# Patient Record
Sex: Male | Born: 1970 | Race: White | Hispanic: No | Marital: Married | Smoking: Never smoker
Health system: Southern US, Community
[De-identification: ages and names within clinical notes are randomized; demographics above are authoritative.]

## PROBLEM LIST (undated history)

## (undated) DIAGNOSIS — S86009A Unspecified injury of unspecified Achilles tendon, initial encounter: Secondary | ICD-10-CM

## (undated) HISTORY — PX: MOUTH SURGERY: SHX715

---

## 2007-08-26 ENCOUNTER — Ambulatory Visit (HOSPITAL_COMMUNITY): Admission: RE | Admit: 2007-08-26 | Discharge: 2007-08-26 | Payer: Self-pay | Admitting: Internal Medicine

## 2009-06-03 IMAGING — CR DG ANKLE COMPLETE 3+V*L*
3 series · 3 of 3 positions shown · non-contrast
Comparison: None

CLINICAL DATA: LEFT ANKLE COMPLETE - 3+ VIEW

Basketball injury - lateral pain and swelling

[t ankle joint ap left]
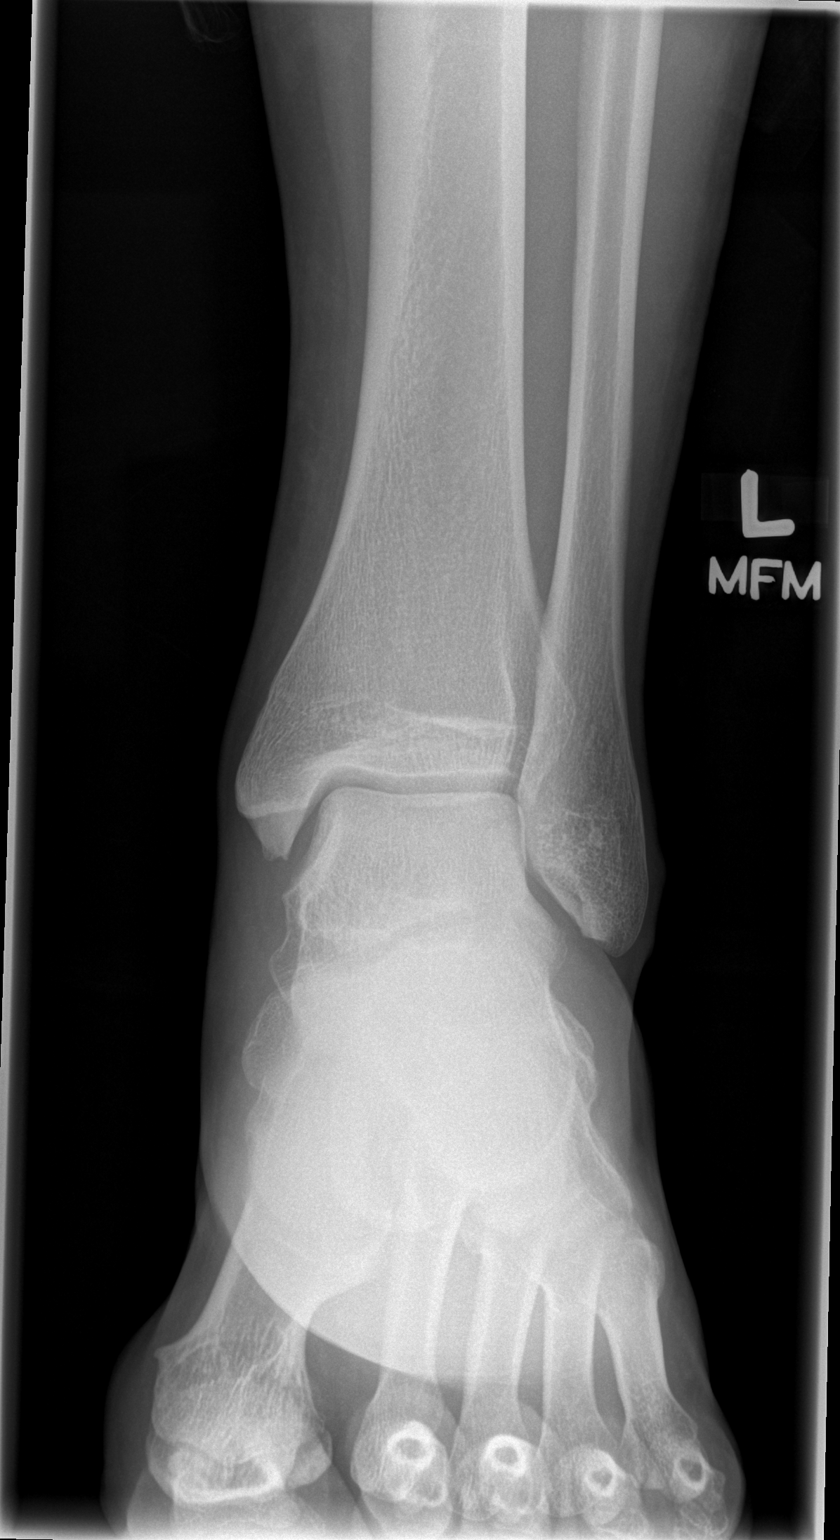

[t ankle joint oblique left]
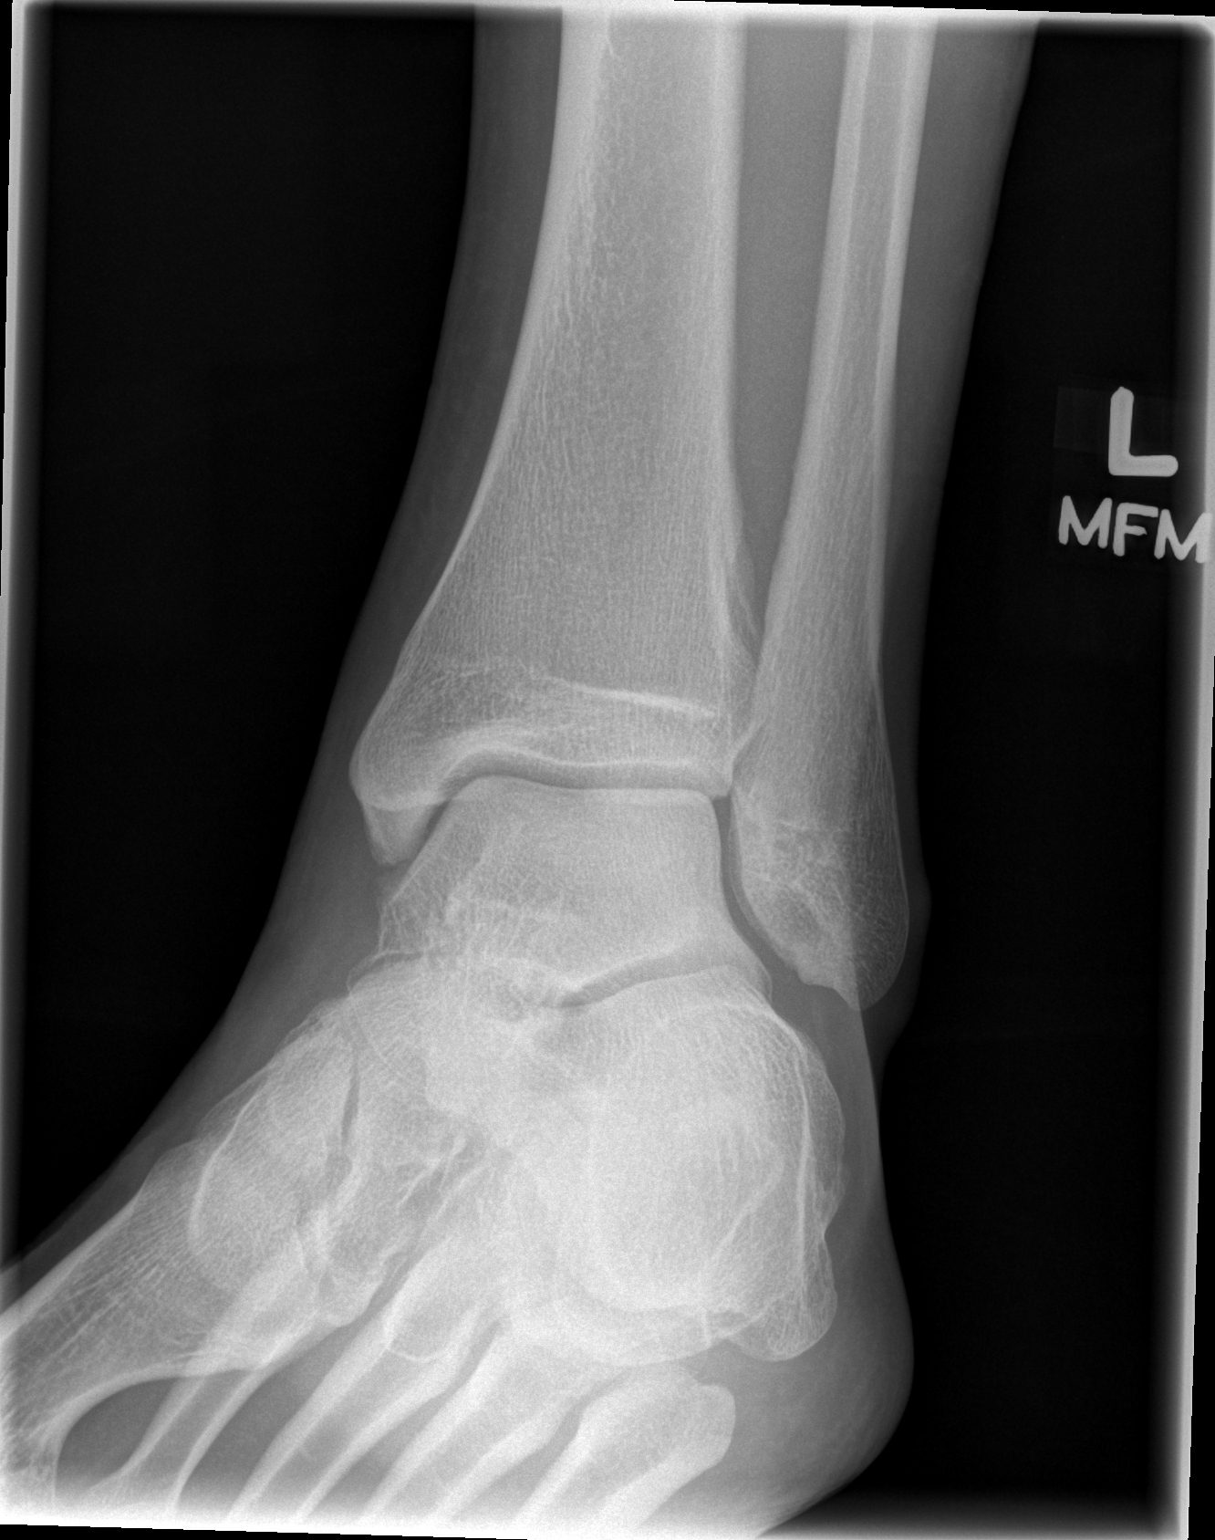

[t ankle joint lat left]
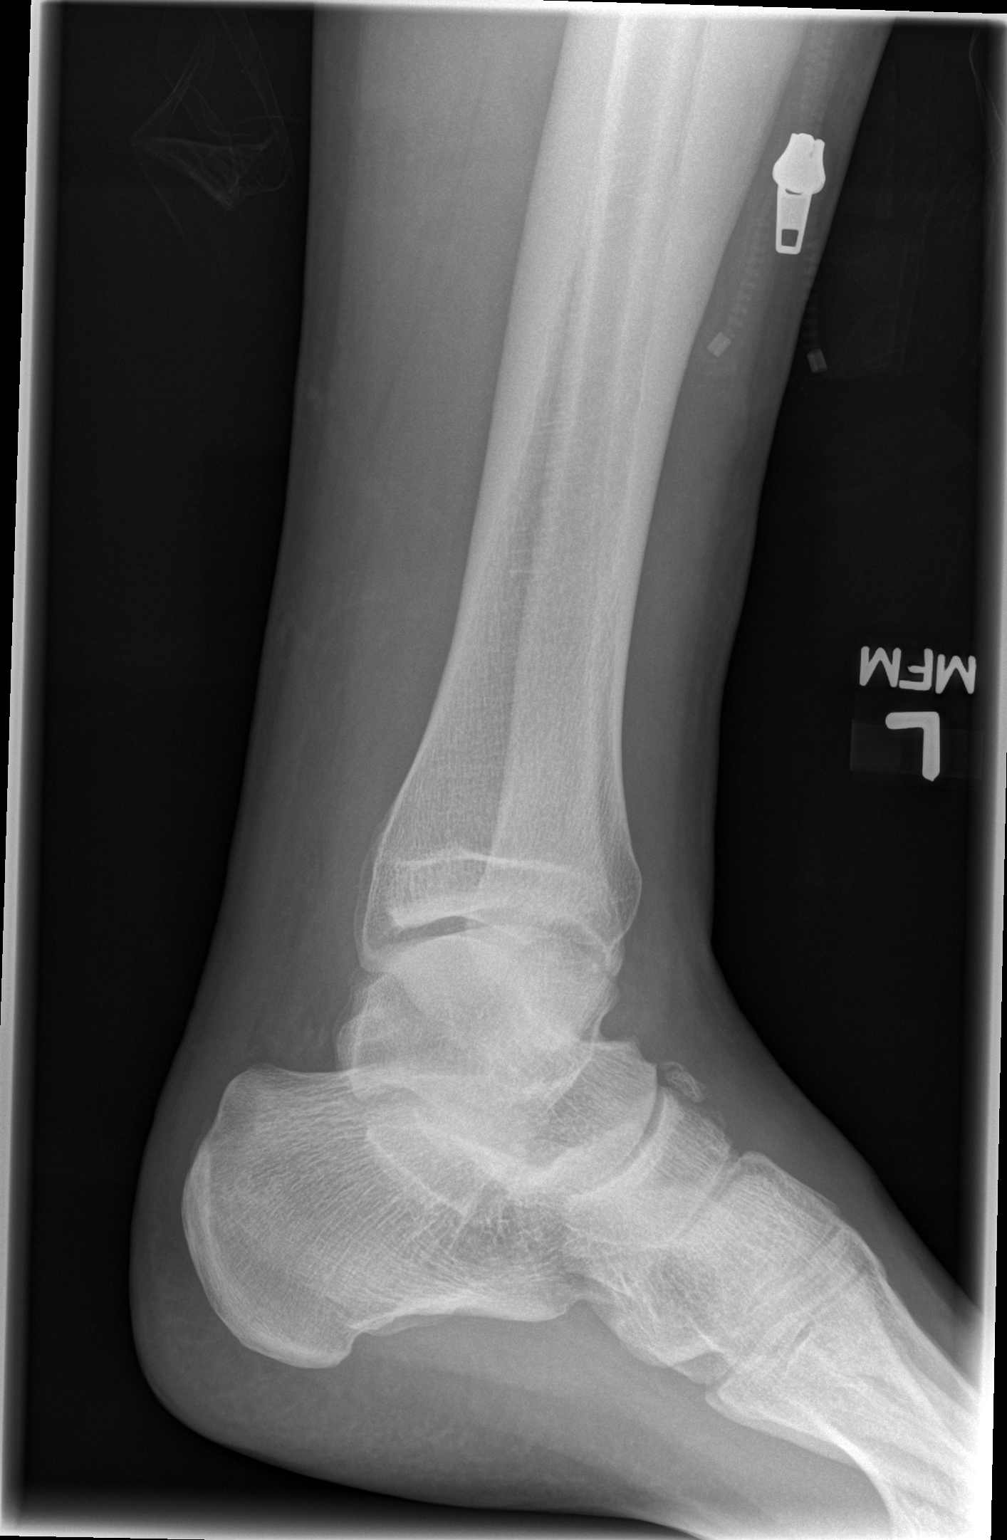

[3 of 3 positions shown; findings below may reference images not displayed]

FINDINGS: No fracture, dislocation, or pus focal soft tissue
swelling.  Old avulsion off of the dorsum of the navicular.
IMPRESSION: No acute findings - or old avulsion off navicular bone.

## 2016-07-22 ENCOUNTER — Other Ambulatory Visit (HOSPITAL_COMMUNITY): Payer: Self-pay | Admitting: Internal Medicine

## 2016-07-22 ENCOUNTER — Ambulatory Visit (HOSPITAL_COMMUNITY)
Admission: RE | Admit: 2016-07-22 | Discharge: 2016-07-22 | Disposition: A | Discharge status: Home / Self Care | Payer: PRIVATE HEALTH INSURANCE | Source: Ambulatory Visit | Attending: Vascular Surgery | Admitting: Vascular Surgery

## 2016-07-22 DIAGNOSIS — I809 Phlebitis and thrombophlebitis of unspecified site: Secondary | ICD-10-CM | POA: Diagnosis not present

## 2016-07-22 DIAGNOSIS — I824Z1 Acute embolism and thrombosis of unspecified deep veins of right distal lower extremity: Secondary | ICD-10-CM | POA: Insufficient documentation

## 2016-10-10 ENCOUNTER — Ambulatory Visit (HOSPITAL_COMMUNITY)
Admission: EM | Admit: 2016-10-10 | Discharge: 2016-10-10 | Disposition: A | Discharge status: Home / Self Care | Payer: PRIVATE HEALTH INSURANCE | Attending: Internal Medicine | Admitting: Internal Medicine

## 2016-10-10 ENCOUNTER — Ambulatory Visit (INDEPENDENT_AMBULATORY_CARE_PROVIDER_SITE_OTHER): Payer: PRIVATE HEALTH INSURANCE

## 2016-10-10 ENCOUNTER — Encounter (HOSPITAL_COMMUNITY): Payer: Self-pay | Admitting: Family Medicine

## 2016-10-10 DIAGNOSIS — Y9366 Activity, soccer: Secondary | ICD-10-CM | POA: Diagnosis not present

## 2016-10-10 DIAGNOSIS — S86012A Strain of left Achilles tendon, initial encounter: Secondary | ICD-10-CM

## 2016-10-10 MED ORDER — IBUPROFEN 800 MG PO TABS
ORAL_TABLET | ORAL | Status: AC
  Filled 2016-10-10: qty 1

## 2016-10-10 MED ORDER — IBUPROFEN 800 MG PO TABS
800.0000 mg | ORAL_TABLET | Freq: Once | ORAL | Status: AC
  Administered 2016-10-10: 800 mg via ORAL

## 2016-10-10 MED ORDER — NAPROXEN 500 MG PO TABS: 500.0000 mg | ORAL_TABLET | Freq: Two times a day (BID) | ORAL | 0 refills | Status: DC

## 2016-10-10 NOTE — Discharge Instructions (Addendum)
Use crutches to keep weight off of left leg.  Wear splint.  [facility]14 to make appointment to be seen early next week by Dr Victorino DikeHewitt.  Ibuprofen or naprosyn as needed for pain.  Ice/elevate as needed to reduce pain/swelling.

## 2016-10-10 NOTE — ED Triage Notes (Signed)
Pt here for left foot pain more in the heel and achilles area. sts he was playing soccer and felt a pop.

## 2016-10-10 NOTE — Progress Notes (Signed)
Orthopedic Tech Progress Note Patient Details:  The[facility]ore [facility]insRichard K [facility] 1970-11-13 [id number]  Ortho Devices Type of Ortho Device: Ace wrap, Post (short leg) splint Ortho Device/Splint Location: LLE Ortho Device/Splint Interventions: Ordered, Application   Jennye MoccasinHughes, Vanessia Bokhari Craig 10/10/2016, 9:38 PM

## 2016-10-10 NOTE — ED Provider Notes (Signed)
[facility]    CSN: [id number] Arrival date & time: 10/10/16  1853     History   Chief Complaint Chief Complaint  Patient presents with  . Foot Injury    HPI [name] Wright is a 46 y.o. male. Playing soccer today, went to kick and felt/heard a pop in left heel.  Pain with walking.  Trainer worried about achilles tendon rupture.    HPI  History reviewed. No pertinent past medical history.  History reviewed. No pertinent surgical history.     Home Medications    Prior to Admission medications   Medication Sig Start Date End Date Taking? Authorizing Provider  naproxen (NAPROSYN) 500 MG tablet Take 1 tablet (500 mg total) by mouth 2 (two) times daily. 10/10/16   Eustace Moore, MD    Family History History reviewed. No pertinent family history.  Social History Social History  Substance Use Topics  . Smoking status: Never Smoker  . Smokeless tobacco: Never Used  . Alcohol use Not on file     Allergies   Patient has no known allergies.   Review of Systems Review of Systems  All other systems reviewed and are negative.    Physical Exam Triage Vital Signs ED Triage Vitals [10/10/16 2019]  Enc Vitals Group     BP 136/80     Pulse Rate 70     Resp 18     Temp 98.2 F (36.8 C)     Temp Source Oral     SpO2 100 %     Weight      Peak Flow      Pain Score 7     Pain Loc    Updated Vital Signs BP 136/80   Pulse 70   Temp 98.2 F (36.8 C) (Oral)   Resp 18   SpO2 100%   Physical Exam  Constitutional: He is oriented to person, place, and time. No distress.  Alert, nicely groomed  HENT:  Head: Atraumatic.  Eyes:  Conjugate gaze, no eye redness/drainage  Neck: Neck supple.  Cardiovascular: Normal rate.   Pulmonary/Chest: No respiratory distress.  Abdominal: He exhibits no distension.  Musculoskeletal: Normal range of motion.  No foot motion with left calf squeeze Deficit in left achilles tendon palpable, although by palpation does  not appear to be a complete rupture.  Swollen, mildly tender.   Neurological: He is alert and oriented to person, place, and time.  Skin: Skin is warm and dry.  No cyanosis  Nursing note and vitals reviewed.    UC Treatments / Results   Radiology Dg Foot Complete Left  Result Date: 10/10/2016 CLINICAL DATA:  Left foot pain after sports injury today EXAM: LEFT FOOT - COMPLETE 3+ VIEW COMPARISON:  08/26/2007 left foot radiographs. FINDINGS: There is prominent soft tissue swelling throughout the region of the left Achilles tendon. Enthesophytes are noted in the region of the left Achilles tendon. No fracture or dislocation. No suspicious focal osseous lesion. 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 No radiopaque foreign body. IMPRESSION: No fracture or dislocation in the left foot. Prominent soft tissue swelling throughout the region of the left Achilles tendon, worrisome for acute left Achilles tendon tear. Electronically Signed   By: Delbert Phenix M.D.   On: 10/10/2016 20:31    Procedures Procedures (including critical care time) Posterior short leg splint applied by ortho tech.  Crutches.  Non weight bearing. Talked to Dr Linna Caprice about followup next week.   Medications Ordered  in UC Medications  ibuprofen (ADVIL,MOTRIN) tablet 800 mg (800 mg Oral Given 10/10/16 2059)     Final Clinical Impressions(s) / UC Diagnoses   Final diagnoses:  Achilles tendon rupture, left, initial encounter   Use crutches to keep weight off of left leg.  Wear splint.  [facility]14 to make appointment to be seen early next week by Dr Victorino DikeHewitt.  Ibuprofen or naprosyn as needed for pain.  Ice/elevate as needed to reduce pain/swelling.    New Prescriptions Discharge Medication List as of 10/10/2016  9:36 PM    START taking these medications   Details  naproxen (NAPROSYN) 500 MG tablet Take 1 tablet (500 mg total) by mouth 2 (two) times daily., Starting Sat  10/10/2016, Normal         Eustace MooreMurray, Rashaunda Rahl W, MD 10/13/16 1106

## 2016-10-12 ENCOUNTER — Other Ambulatory Visit: Payer: Self-pay | Admitting: Orthopedic Surgery

## 2016-10-12 ENCOUNTER — Encounter (HOSPITAL_BASED_OUTPATIENT_CLINIC_OR_DEPARTMENT_OTHER): Payer: Self-pay | Admitting: *Deleted

## 2016-10-15 ENCOUNTER — Encounter (HOSPITAL_BASED_OUTPATIENT_CLINIC_OR_DEPARTMENT_OTHER)
Admission: RE | Disposition: A | Discharge status: Home / Self Care | Payer: Self-pay | Source: Ambulatory Visit | Attending: Orthopedic Surgery

## 2016-10-15 ENCOUNTER — Ambulatory Visit (HOSPITAL_BASED_OUTPATIENT_CLINIC_OR_DEPARTMENT_OTHER)
Admission: RE | Admit: 2016-10-15 | Discharge: 2016-10-15 | Disposition: A | Discharge status: Home / Self Care | Payer: PRIVATE HEALTH INSURANCE | Source: Ambulatory Visit | Attending: Orthopedic Surgery | Admitting: Orthopedic Surgery

## 2016-10-15 ENCOUNTER — Encounter (HOSPITAL_BASED_OUTPATIENT_CLINIC_OR_DEPARTMENT_OTHER): Payer: Self-pay | Admitting: Certified Registered"

## 2016-10-15 ENCOUNTER — Other Ambulatory Visit: Payer: Self-pay | Admitting: Orthopedic Surgery

## 2016-10-15 ENCOUNTER — Ambulatory Visit (HOSPITAL_BASED_OUTPATIENT_CLINIC_OR_DEPARTMENT_OTHER): Payer: PRIVATE HEALTH INSURANCE | Admitting: Certified Registered"

## 2016-10-15 DIAGNOSIS — Y92322 Soccer field as the place of occurrence of the external cause: Secondary | ICD-10-CM | POA: Diagnosis not present

## 2016-10-15 DIAGNOSIS — M9262 Juvenile osteochondrosis of tarsus, left ankle: Secondary | ICD-10-CM | POA: Diagnosis not present

## 2016-10-15 DIAGNOSIS — S86092A Other specified injury of left Achilles tendon, initial encounter: Secondary | ICD-10-CM | POA: Diagnosis not present

## 2016-10-15 DIAGNOSIS — M6702 Short Achilles tendon (acquired), left ankle: Secondary | ICD-10-CM | POA: Insufficient documentation

## 2016-10-15 DIAGNOSIS — Y9366 Activity, soccer: Secondary | ICD-10-CM | POA: Insufficient documentation

## 2016-10-15 HISTORY — PX: GASTROCNEMIUS RECESSION: SHX863

## 2016-10-15 HISTORY — DX: Unspecified injury of unspecified achilles tendon, initial encounter: S86.009A

## 2016-10-15 HISTORY — PX: EXCISION HAGLUND'S DEFORMITY WITH ACHILLES TENDON REPAIR: SHX5627

## 2016-10-15 SURGERY — RECESSION, MUSCLE, GASTROCNEMIUS
Anesthesia: General | Site: Leg Lower | Laterality: Left

## 2016-10-15 MED ORDER — SCOPOLAMINE 1 MG/3DAYS TD PT72: 1.0000 | MEDICATED_PATCH | Freq: Once | TRANSDERMAL | Status: DC | PRN

## 2016-10-15 MED ORDER — DEXAMETHASONE SODIUM PHOSPHATE 4 MG/ML IJ SOLN
INTRAMUSCULAR | Status: DC | PRN
Start: 2016-10-15 — End: 2016-10-15
  Administered 2016-10-15: 10 mg via INTRAVENOUS

## 2016-10-15 MED ORDER — DOCUSATE SODIUM 100 MG PO CAPS: 100.0000 mg | ORAL_CAPSULE | Freq: Two times a day (BID) | ORAL | 0 refills | Status: DC

## 2016-10-15 MED ORDER — 0.9 % SODIUM CHLORIDE (POUR BTL) OPTIME
TOPICAL | Status: DC | PRN
  Administered 2016-10-15: 250 mL

## 2016-10-15 MED ORDER — ASPIRIN EC 81 MG PO TBEC: 81.0000 mg | DELAYED_RELEASE_TABLET | Freq: Two times a day (BID) | ORAL | 0 refills | Status: DC

## 2016-10-15 MED ORDER — LIDOCAINE 2% (20 MG/ML) 5 ML SYRINGE
INTRAMUSCULAR | Status: DC | PRN
  Administered 2016-10-15: 30 mg via INTRAVENOUS

## 2016-10-15 MED ORDER — ONDANSETRON HCL 4 MG/2ML IJ SOLN
INTRAMUSCULAR | Status: DC | PRN
  Administered 2016-10-15: 4 mg via INTRAVENOUS

## 2016-10-15 MED ORDER — BUPIVACAINE-EPINEPHRINE (PF) 0.5% -1:200000 IJ SOLN
INTRAMUSCULAR | Status: DC | PRN
  Administered 2016-10-15: 30 mL via PERINEURAL

## 2016-10-15 MED ORDER — FENTANYL CITRATE (PF) 100 MCG/2ML IJ SOLN
50.0000 ug | INTRAMUSCULAR | Status: DC | PRN
  Administered 2016-10-15: 100 ug via INTRAVENOUS

## 2016-10-15 MED ORDER — CEFAZOLIN SODIUM-DEXTROSE 2-4 GM/100ML-% IV SOLN
2.0000 g | INTRAVENOUS | Status: AC
  Administered 2016-10-15: 2 g via INTRAVENOUS

## 2016-10-15 MED ORDER — LACTATED RINGERS IV SOLN
INTRAVENOUS | Status: DC
  Administered 2016-10-15 (×2): via INTRAVENOUS

## 2016-10-15 MED ORDER — SODIUM CHLORIDE 0.9 % IV SOLN: INTRAVENOUS | Status: DC

## 2016-10-15 MED ORDER — CHLORHEXIDINE GLUCONATE 4 % EX LIQD: 60.0000 mL | Freq: Once | CUTANEOUS | Status: DC

## 2016-10-15 MED ORDER — PROPOFOL 10 MG/ML IV BOLUS
INTRAVENOUS | Status: DC | PRN
  Administered 2016-10-15: 200 mg via INTRAVENOUS

## 2016-10-15 MED ORDER — MIDAZOLAM HCL 2 MG/2ML IJ SOLN
1.0000 mg | INTRAMUSCULAR | Status: DC | PRN
  Administered 2016-10-15: 2 mg via INTRAVENOUS

## 2016-10-15 MED ORDER — SUCCINYLCHOLINE CHLORIDE 20 MG/ML IJ SOLN
INTRAMUSCULAR | Status: DC | PRN
  Administered 2016-10-15: 100 mg via INTRAVENOUS

## 2016-10-15 MED ORDER — FENTANYL CITRATE (PF) 100 MCG/2ML IJ SOLN
INTRAMUSCULAR | Status: AC
  Filled 2016-10-15: qty 2

## 2016-10-15 MED ORDER — CEFAZOLIN SODIUM-DEXTROSE 2-4 GM/100ML-% IV SOLN
INTRAVENOUS | Status: AC
  Filled 2016-10-15: qty 100

## 2016-10-15 MED ORDER — MIDAZOLAM HCL 2 MG/2ML IJ SOLN
INTRAMUSCULAR | Status: AC
  Filled 2016-10-15: qty 2

## 2016-10-15 MED ORDER — OXYCODONE HCL 5 MG PO TABS: 5.0000 mg | ORAL_TABLET | ORAL | 0 refills | Status: DC | PRN

## 2016-10-15 MED ORDER — SENNA 8.6 MG PO TABS: 2.0000 | ORAL_TABLET | Freq: Two times a day (BID) | ORAL | 0 refills | Status: DC

## 2016-10-15 SURGICAL SUPPLY — 82 items
ANCHOR SUT GL2 QUATTRO 2.9 (Anchor) ×8 IMPLANT
BANDAGE ESMARK 6X9 LF (GAUZE/BANDAGES/DRESSINGS) ×2 IMPLANT
BLADE AVERAGE 25MMX9MM (BLADE)
BLADE AVERAGE 25X9 (BLADE) IMPLANT
BLADE MICRO SAGITTAL (BLADE) ×4 IMPLANT
BLADE SURG 15 STRL LF DISP TIS (BLADE) ×4 IMPLANT
BLADE SURG 15 STRL SS (BLADE) ×4
BNDG COHESIVE 4X5 TAN STRL (GAUZE/BANDAGES/DRESSINGS) ×4 IMPLANT
BNDG COHESIVE 6X5 TAN STRL LF (GAUZE/BANDAGES/DRESSINGS) ×4 IMPLANT
BNDG ESMARK 6X9 LF (GAUZE/BANDAGES/DRESSINGS) ×4
BOOT STEPPER DURA LG (SOFTGOODS) IMPLANT
BOOT STEPPER DURA MED (SOFTGOODS) IMPLANT
CANISTER SUCT 1200ML W/VALVE (MISCELLANEOUS) ×4 IMPLANT
CHLORAPREP W/TINT 26ML (MISCELLANEOUS) ×4 IMPLANT
COVER BACK TABLE 60X90IN (DRAPES) ×4 IMPLANT
CUFF TOURNIQUET SINGLE 34IN LL (TOURNIQUET CUFF) ×4 IMPLANT
DRAPE EXTREMITY T 121X128X90 (DRAPE) ×4 IMPLANT
DRAPE OEC MINIVIEW 54X84 (DRAPES) IMPLANT
DRAPE U-SHAPE 47X51 STRL (DRAPES) ×4 IMPLANT
DRSG MEPITEL 4X7.2 (GAUZE/BANDAGES/DRESSINGS) ×4 IMPLANT
DRSG PAD ABDOMINAL 8X10 ST (GAUZE/BANDAGES/DRESSINGS) ×8 IMPLANT
ELECT REM PT RETURN 9FT ADLT (ELECTROSURGICAL) ×4
ELECTRODE REM PT RTRN 9FT ADLT (ELECTROSURGICAL) ×2 IMPLANT
GAUZE SPONGE 4X4 12PLY STRL (GAUZE/BANDAGES/DRESSINGS) ×4 IMPLANT
GLOVE BIO SURGEON STRL SZ8 (GLOVE) ×4 IMPLANT
GLOVE BIOGEL PI IND STRL 7.0 (GLOVE) ×4 IMPLANT
GLOVE BIOGEL PI IND STRL 8 (GLOVE) ×4 IMPLANT
GLOVE BIOGEL PI INDICATOR 7.0 (GLOVE) ×4
GLOVE BIOGEL PI INDICATOR 8 (GLOVE) ×4
GLOVE ECLIPSE 6.5 STRL STRAW (GLOVE) ×8 IMPLANT
GLOVE ECLIPSE 8.0 STRL XLNG CF (GLOVE) ×4 IMPLANT
GOWN STRL REUS W/ TWL LRG LVL3 (GOWN DISPOSABLE) ×2 IMPLANT
GOWN STRL REUS W/ TWL XL LVL3 (GOWN DISPOSABLE) ×4 IMPLANT
GOWN STRL REUS W/TWL LRG LVL3 (GOWN DISPOSABLE) ×2
GOWN STRL REUS W/TWL XL LVL3 (GOWN DISPOSABLE) ×4
IMPL ANCHOR PEEK 4.5MM (Anchor) ×4 IMPLANT
IMPLANT ANCHOR PEEK 4.5MM (Anchor) ×8 IMPLANT
KIT BIO-TENODESIS 3X8 DISP (MISCELLANEOUS)
KIT INSRT BABSR STRL DISP BTN (MISCELLANEOUS) IMPLANT
NDL SAFETY ECLIPSE 18X1.5 (NEEDLE) IMPLANT
NDL SUT 6 .5 CRC .975X.05 MAYO (NEEDLE) ×2 IMPLANT
NEEDLE HYPO 18GX1.5 SHARP (NEEDLE)
NEEDLE HYPO 22GX1.5 SAFETY (NEEDLE) IMPLANT
NEEDLE HYPO 25X1 1.5 SAFETY (NEEDLE) IMPLANT
NEEDLE MAYO TAPER (NEEDLE) ×2
NS IRRIG 1000ML POUR BTL (IV SOLUTION) ×4 IMPLANT
PACK BASIN DAY SURGERY FS (CUSTOM PROCEDURE TRAY) ×4 IMPLANT
PAD CAST 4YDX4 CTTN HI CHSV (CAST SUPPLIES) ×2 IMPLANT
PADDING CAST ABS 4INX4YD NS (CAST SUPPLIES)
PADDING CAST ABS COTTON 4X4 ST (CAST SUPPLIES) IMPLANT
PADDING CAST COTTON 4X4 STRL (CAST SUPPLIES) ×2
PADDING CAST COTTON 6X4 STRL (CAST SUPPLIES) ×4 IMPLANT
PENCIL BUTTON HOLSTER BLD 10FT (ELECTRODE) ×4 IMPLANT
SANITIZER HAND PURELL 535ML FO (MISCELLANEOUS) ×4 IMPLANT
SHEET MEDIUM DRAPE 40X70 STRL (DRAPES) ×4 IMPLANT
SLEEVE SCD COMPRESS KNEE MED (MISCELLANEOUS) ×4 IMPLANT
SPLINT FAST PLASTER 5X30 (CAST SUPPLIES) ×40
SPLINT PLASTER CAST FAST 5X30 (CAST SUPPLIES) ×40 IMPLANT
SPONGE LAP 18X18 X RAY DECT (DISPOSABLE) ×4 IMPLANT
STAPLER VISISTAT 35W (STAPLE) IMPLANT
STOCKINETTE 6  STRL (DRAPES) ×2
STOCKINETTE 6 STRL (DRAPES) ×2 IMPLANT
SUCTION FRAZIER HANDLE 10FR (MISCELLANEOUS) ×2
SUCTION TUBE FRAZIER 10FR DISP (MISCELLANEOUS) ×2 IMPLANT
SUT ETHIBOND 2 OS 4 DA (SUTURE) IMPLANT
SUT ETHIBOND 3-0 V-5 (SUTURE) IMPLANT
SUT ETHILON 3 0 PS 1 (SUTURE) ×12 IMPLANT
SUT FIBERWIRE #2 38 T-5 BLUE (SUTURE)
SUT MNCRL AB 3-0 PS2 18 (SUTURE) ×4 IMPLANT
SUT VIC AB 0 CT1 27 (SUTURE)
SUT VIC AB 0 CT1 27XBRD ANBCTR (SUTURE) IMPLANT
SUT VIC AB 0 SH 27 (SUTURE) ×8 IMPLANT
SUT VIC AB 2-0 SH 27 (SUTURE) ×2
SUT VIC AB 2-0 SH 27XBRD (SUTURE) ×2 IMPLANT
SUTURE FIBERWR #2 38 T-5 BLUE (SUTURE) IMPLANT
SYR BULB 3OZ (MISCELLANEOUS) ×4 IMPLANT
SYR CONTROL 10ML LL (SYRINGE) IMPLANT
TOWEL OR 17X24 6PK STRL BLUE (TOWEL DISPOSABLE) ×8 IMPLANT
TUBE CONNECTING 20'X1/4 (TUBING) ×1
TUBE CONNECTING 20X1/4 (TUBING) ×3 IMPLANT
UNDERPAD 30X30 (UNDERPADS AND DIAPERS) ×4 IMPLANT
YANKAUER SUCT BULB TIP NO VENT (SUCTIONS) IMPLANT

## 2016-10-15 NOTE — Anesthesia Procedure Notes (Signed)
Procedure Name: Intubation Date/Time: 10/15/2016 9:58 AM Performed by: Luka Stohr D Pre-anesthesia Checklist: Patient identified, Emergency Drugs available, Suction available and Patient being monitored Patient Re-evaluated:Patient Re-evaluated prior to inductionOxygen Delivery Method: Circle system utilized Preoxygenation: Pre-oxygenation with 100% oxygen Intubation Type: IV induction Ventilation: Mask ventilation without difficulty Laryngoscope Size: Mac and 3 Grade View: Grade I Tube type: Oral Tube size: 6.5 mm Number of attempts: 1 Airway Equipment and Method: Stylet and Oral airway Placement Confirmation: ETT inserted through vocal cords under direct vision,  positive ETCO2 and breath sounds checked- equal and bilateral Secured at: 21 cm Tube secured with: Tape Dental Injury: Teeth and Oropharynx as per pre-operative assessment

## 2016-10-15 NOTE — Anesthesia Preprocedure Evaluation (Signed)
Anesthesia Evaluation  Patient identified by MRN, date of birth, ID band Patient awake    Reviewed: Allergy & Precautions, NPO status , Patient's Chart, lab work & pertinent test results  Airway Mallampati: II  TM Distance: >3 FB Neck ROM: Full    Dental  (+) Teeth Intact, Dental Advisory Given   Pulmonary neg pulmonary ROS,    Pulmonary exam normal breath sounds clear to auscultation       Cardiovascular Exercise Tolerance: Good negative cardio ROS Normal cardiovascular exam Rhythm:Regular Rate:Normal     Neuro/Psych negative neurological ROS     GI/Hepatic negative GI ROS, Neg liver ROS,   Endo/Other  negative endocrine ROS  Renal/GU negative Renal ROS     Musculoskeletal negative musculoskeletal ROS (+)   Abdominal   Peds  Hematology negative hematology ROS (+)   Anesthesia Other Findings Day of surgery medications reviewed with the patient.  Reproductive/Obstetrics                             Anesthesia Physical Anesthesia Plan  ASA: I  Anesthesia Plan: General   Post-op Pain Management:  Regional for Post-op pain   Induction: Intravenous  Airway Management Planned: Oral ETT  Additional Equipment:   Intra-op Plan:   Post-operative Plan: Extubation in OR  Informed Consent: I have reviewed the patients History and Physical, chart, labs and discussed the procedure including the risks, benefits and alternatives for the proposed anesthesia with the patient or authorized representative who has indicated his/her understanding and acceptance.   Dental advisory given  Plan Discussed with: CRNA  Anesthesia Plan Comments: (Risks/benefits of general anesthesia discussed with patient including risk of damage to teeth, lips, gum, and tongue, nausea/vomiting, allergic reactions to medications, and the possibility of heart attack, stroke and death.  All patient questions answered.   Patient wishes to proceed.)        Anesthesia Quick Evaluation

## 2016-10-15 NOTE — Anesthesia Procedure Notes (Signed)
Anesthesia Regional Block: Popliteal block   Pre-Anesthetic Checklist: ,, timeout performed, Correct Patient, Correct Site, Correct Laterality, Correct Procedure, Correct Position, site marked, Risks and benefits discussed,  Surgical consent,  Pre-op evaluation,  At surgeon's request and post-op pain management  Laterality: Left  Prep: chloraprep       Needles:  Injection technique: Single-shot  Needle Type: Echogenic Needle     Needle Length: 9cm  Needle Gauge: 21     Additional Needles:   Procedures: ultrasound guided,,,,,,,,  Narrative:  Start time: 10/15/2016 8:36 AM End time: 10/15/2016 8:43 AM Injection made incrementally with aspirations every 5 mL.  Performed by: Personally  Anesthesiologist: Cecile HearingURK, Saori Umholtz EDWARD  Additional Notes: No pain on injection. No increased resistance to injection. Injection made in 5cc increments.  Good needle visualization.  Patient tolerated procedure well.  Combined left popliteal/saphenous nerve block.

## 2016-10-15 NOTE — Brief Op Note (Signed)
10/15/2016  11:18 AM  PATIENT:  [name] Wright  46 y.o. male  PRE-OPERATIVE DIAGNOSIS:  Left Achilles Avulsion Rupture   POST-OPERATIVE DIAGNOSIS: 1.  Left Achilles Avulsion      2.  Left Haglund deformity      3.  Left tight heelcord   Procedure(s): 1.  Left achilles tendon reconstruction 2.  Excision of left calcaneus Haglund deformity 3.  Left gastrocnemius recession (separate incision)  SURGEON:  Toni ArthursJohn Irina Okelly, MD  ASSISTANT:  Alfredo MartinezJustin Ollis, PA-C  ANESTHESIA:   General, regional  EBL:  minimal   TOURNIQUET:   Total Tourniquet Time Documented: Thigh (Left) - 57 minutes Total: Thigh (Left) - 57 minutes   COMPLICATIONS:  None apparent  DISPOSITION:  Extubated, awake and stable to recovery.  DICTATION ID:  [id number]

## 2016-10-15 NOTE — Transfer of Care (Signed)
Immediate Anesthesia Transfer of Care Note  Patient: [name] Wright  Procedure(s) Performed: Procedure(s): GASTROCNEMIUS RECESSION (Left) ACHILLES TENDON REPAIR WITH EXCISION HAGLUND'S DEFORMITY (Left)  Patient Location: PACU  Anesthesia Type:GA combined with regional for post-op pain  Level of Consciousness: awake and patient cooperative  Airway & Oxygen Therapy: Patient Spontanous Breathing and Patient connected to face mask oxygen  Post-op Assessment: Report given to RN and Post -op Vital signs reviewed and stable  Post vital signs: Reviewed and stable  Last Vitals:  Vitals:   10/15/16 0845 10/15/16 0846  BP:    Pulse: (!) 57 (!) 55  Resp: 12 12    Last Pain:  Vitals:   10/15/16 0807  TempSrc: Oral  PainSc: 0-No pain         Complications: No apparent anesthesia complications

## 2016-10-15 NOTE — Discharge Instructions (Addendum)
Toni Arthurs, MD [facility]  Please read the following information regarding your care after surgery.  Medications  You only need a prescription for the narcotic pain medicine (ex. oxycodone, Percocet, Norco).  All of the other medicines listed below are available over the counter. X acetominophen (Tylenol) 650 mg every 4-6 hours as you need for minor pain X oxycodone as prescribed for moderate to severe pain X aleve 220 mg -  2 tablets twice daily with food as needed for pain   Narcotic pain medicine (ex. oxycodone, Percocet, Vicodin) will cause constipation.  To prevent this problem, take the following medicines while you are taking any pain medicine. X docusate sodium (Colace) 100 mg twice a day X senna (Senokot) 2 tablets twice a day  X To help prevent blood clots, take a baby aspirin (81 mg) twice a day for two weeks after surgery.  You should also get up every hour while you are awake to move around.    Weight Bearing X Do not bear any weight on the operated leg or foot.  Cast / Splint / Dressing X Keep your splint or cast clean and dry.  Dont put anything (coat hanger, pencil, etc) down inside of it.  If it gets damp, use a hair dryer on the cool setting to dry it.  If it gets soaked, call the office to schedule an appointment for a cast change.  After your dressing, cast or splint is removed; you may shower, but do not soak or scrub the wound.  Allow the water to run over it, and then gently pat it dry.  Swelling It is normal for you to have swelling where you had surgery.  To reduce swelling and pain, keep your toes above your nose for at least 3 days after surgery.  It may be necessary to keep your foot or leg elevated for several weeks.  If it hurts, it should be elevated.  Follow Up Call my office at [phone] when you are discharged from the hospital or surgery center to schedule an appointment to be seen two weeks after surgery.  Call my office at  [phone] if you develop a fever >101.5 F, nausea, vomiting, bleeding from the surgical site or severe pain.       Regional Anesthesia Blocks  1. Numbness or the inability to move the "blocked" extremity may last from 3-48 hours after placement. The length of time depends on the medication injected and your individual response to the medication. If the numbness is not going away after 48 hours, call your surgeon.  2. The extremity that is blocked will need to be protected until the numbness is gone and the  Strength has returned. Because you cannot feel it, you will need to take extra care to avoid injury. Because it may be weak, you may have difficulty moving it or using it. You may not know what position it is in without looking at it while the block is in effect.  3. For blocks in the legs and feet, returning to weight bearing and walking needs to be done carefully. You will need to wait until the numbness is entirely gone and the strength has returned. You should be able to move your leg and foot normally before you try and bear weight or walk. You will need someone to be with you when you first try to ensure you do not fall and possibly risk injury.  4. Bruising and tenderness at the needle site are common  side effects and will resolve in a few days.  5. Persistent numbness or new problems with movement should be communicated to the surgeon or the [facility] [phone])/ [facility] [phone]).        Post Anesthesia Home Care Instructions  Activity: Get plenty of rest for the remainder of the day. A responsible individual must stay with you for 24 hours following the procedure.  For the next 24 hours, DO NOT: -Drive a car -Advertising copywriterperate machinery -Drink alcoholic beverages -Take any medication unless instructed by your physician -Make any legal decisions or sign important papers.  Meals: Start with liquid foods such as gelatin or soup. Progress  to regular foods as tolerated. Avoid greasy, spicy, heavy foods. If nausea and/or vomiting occur, drink only clear liquids until the nausea and/or vomiting subsides. Call your physician if vomiting continues.  Special Instructions/Symptoms: Your throat may feel dry or sore from the anesthesia or the breathing tube placed in your throat during surgery. If this causes discomfort, gargle with warm salt water. The discomfort should disappear within 24 hours.  If you had a scopolamine patch placed behind your ear for the management of post- operative nausea and/or vomiting:  1. The medication in the patch is effective for 72 hours, after which it should be removed.  Wrap patch in a tissue and discard in the trash. Wash hands thoroughly with soap and water. 2. You may remove the patch earlier than 72 hours if you experience unpleasant side effects which may include dry mouth, dizziness or visual disturbances. 3. Avoid touching the patch. Wash your hands with soap and water after contact with the patch.

## 2016-10-15 NOTE — Anesthesia Postprocedure Evaluation (Signed)
Anesthesia Post Note  Patient: Jett K Donna  Procedure(s) Performed: Procedure(s) (LRB): GASTROCNEMIUS RECESSION (Left) ACHILLES TENDON REPAIR WITH EXCISION HAGLUND'S DEFORMITY (Left)  Patient location during evaluation: PACU Anesthesia Type: General Level of consciousness: awake and alert Pain management: pain level controlled Vital Signs Assessment: post-procedure vital signs reviewed and stable Respiratory status: spontaneous breathing, nonlabored ventilation, respiratory function stable and patient connected to nasal cannula oxygen Cardiovascular status: blood pressure returned to baseline and stable Postop Assessment: no signs of nausea or vomiting Anesthetic complications: no       Last Vitals:  Vitals:   10/15/16 1130 10/15/16 1145  BP: 136/76 127/74  Pulse: (!) 57 (!) 54  Resp: 16 16  Temp:      Last Pain:  Vitals:   10/15/16 1130  TempSrc:   PainSc: 0-No pain                 Cecile HearingStephen Edward Turk

## 2016-10-15 NOTE — Op Note (Signed)
[name], [name]               ACCOUNT NO.:  [id number]  MEDICAL RECORD NO.:  [id number]  LOCATION:                                 FACILITY:  PHYSICIAN:  [name] ArthursJohn Jariya Reichow, MD        DATE OF BIRTH:  Jul 23, 1970  DATE OF PROCEDURE:  10/15/2016 DATE OF DISCHARGE:                              OPERATIVE REPORT   PREOPERATIVE DIAGNOSIS:  Left Achilles tendon avulsion.  POSTOPERATIVE DIAGNOSES: 1. Left Achilles tendon avulsion from the calcaneus. 2. Left Haglund deformity. 3. Left tight heel cord.  PROCEDURE: 1. Left Achilles tendon reconstruction. 2. Excision of left calcaneus Haglund deformity. 3. Left gastrocnemius recession through a separate incision.  SURGEON:  [name] ArthursJohn Ronita Hargreaves, MD.  ASSISTANT:  [name] MartinezJustin Ollis, PA-C.  ANESTHESIA:  General, regional.  ESTIMATED BLOOD LOSS:  Minimal.  TOURNIQUET TIME:  57 minutes at 250 mmHg.  COMPLICATIONS:  None apparent.  DISPOSITION:  Extubated, awake, and stable to Recovery.  INDICATIONS FOR PROCEDURE:  The patient is a 46 year old male who has a history of left heel pain with activity.  He was playing soccer last weekend and had an injury to the back of his left heel when he was pushing off to run.  His physical exam and MRI findings reveal an avulsion of the Achilles from the calcaneus.  Also evident is a large Haglund deformity of the calcaneus with insertional enthesophytes and calcification within the distal portion of the avulsed tendon.  The patient presents today for operative treatment of this injury.  He understands the risks and benefits of the alternative treatment options and elects surgical treatment.  He specifically understands risks of bleeding, infection, nerve damage, blood clots, need for additional surgery, continued pain, re-rupture, amputation, and death.  PROCEDURE IN DETAIL:  After preoperative consent was obtained and the correct operative site was identified, the patient was brought to the operating room  supine on a stretcher.  General anesthesia was induced. Preoperative antibiotics were administered.  Surgical time-out was taken.  Left lower extremity was exsanguinated, and a thigh tourniquet inflated to 250 mmHg.  The patient was then turned into the prone position on the operating table.  The left lower extremity was then prepped and draped in standard sterile fashion.  A longitudinal incision was then made over the posterior aspect of the heel and sharp dissection was carried down through skin and subcutaneous tissue and paratenon. The location of the avulsion was identified.  There was a large calcified area within the distal portion of the ruptured Achilles. There were some fibers of the Achilles left inserted on the medial and lateral aspects of the calcaneus.  There were degenerative changes within this portion of the tendon.  There was extremely large and prominent Haglund deformity noted as well.  The distal portion of the tendon was debrided from its insertion exposing the posterior enthesophytes.  An oscillating saw was used to resect these bony prominences along with the Haglund deformity.  A retention suture was then placed in the stump of the Achilles.  The stump could not be pulled all the way down to the healthy cut surface of bone.  Attention was then turned to the posterior  aspect of the calf where a longitudinal incision was made.  Dissection was carried down through skin and subcutaneous tissue taking care to protect the lesser saphenous vein and the sural nerve.  The gastrocnemius tendon was identified.  It was divided under direct vision in its entirety.  Wound was irrigated and closed with Monocryl and nylon.  Attention was then returned to the heel where the Achilles stump would reach the bone adequately.  The distal portion of the tendon was debrided of all nonviable tissue.  The tendon was then repaired back down to the cut surface of the calcaneus using 4  [facility] suture anchors in an hourglass pattern of nonabsorbable suture.  After making the repair, the ankle could be dorsiflexed in neutral without any evidence of gapping at the repair site.  Wound was then irrigated copiously.  The remaining medial fibers of the Achilles attached to the calcaneus were repaired over the tendon repair site and tied in with 0 Vicryl figure-of-eight sutures.  Subcutaneous tissues and paratenon were then approximated with inverted simple sutures of 2-0 Vicryl.  Skin incision was closed with horizontal mattress sutures of 3-0 nylon. Sterile dressings were applied followed by well-padded short-leg splint. Tourniquet was released after application of the dressings at 57 minutes.  The patient was awakened from anesthesia and transported to the recovery room in stable condition.  FOLLOWUP PLAN:  The patient will be nonweightbearing on the left lower extremity.  He will follow up with me in the office in 2 weeks for suture removal and conversion to a CAM walker boot with 2 heel lifts.  [name] Martinez, PA-C, was present and scrubbed for the duration of the case.  His assistance was essential in positioning the patient, prepping and draping, gaining and maintaining exposure, performing the operation, closing and dressing the wounds and applying the splint.     [name] Arthurs, MD     JH/MEDQ  D:  10/15/2016  T:  10/15/2016  Job:  [id number]

## 2016-10-15 NOTE — Progress Notes (Signed)
Assisted Dr. Turk with left, ultrasound guided, popliteal/saphenous block. Side rails up, monitors on throughout procedure. See vital signs in flow sheet. Tolerated Procedure well. 

## 2016-10-15 NOTE — H&P (Signed)
[name] Wright is an 46 y.o. male.   Chief Complaint: left achilles rupture HPI: 46 y/o male with left achilles avulsion from the calcaneus while playing soccer last weekend.  He presents today for open treatment of this injury.  Past Medical History:  Diagnosis Date  . Achilles tendon injury     Past Surgical History:  Procedure Laterality Date  . MOUTH SURGERY      History reviewed. No pertinent family history. Social History:  reports that he has never smoked. He has never used smokeless tobacco. He reports that he drinks about 9.0 oz of alcohol per week . He reports that he does not use drugs.  Allergies: No Known Allergies  Medications Prior to Admission  Medication Sig Dispense Refill  . naproxen (NAPROSYN) 500 MG tablet Take 1 tablet (500 mg total) by mouth 2 (two) times daily. 30 tablet 0    No results found for this or any previous visit (from the past 48 hour(s)). No results found.  ROS  No recent f/c/n/v/wt loss  Blood pressure 127/77, pulse (!) 55, resp. rate 12, height 6\' 2"  (1.88 m), weight 97.3 kg (214 lb 6.4 oz), SpO2 100 %. Physical Exam  wn wd male in nad.  A and O x 4.  Mood and affect normal.  EOMI.  Resp unlabored.  L heel TTP posteriorly.  Skin healthy and intact.  NO lymphadenopathy.  5/5 strength in DF of the ankle.  3/5 in plantarflexion.  + Thompson test.  MRI shows avulsion of the achilles from the calcaneus with degenerative changes of the achilles tendon distally.  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  To OR for left gastroc recession, achilles tendon reconstruction and excision of Haglund deformity.  The risks and benefits of the alternative treatment options have been discussed in detail.  The patient wishes to proceed with surgery and specifically understands risks of bleeding, infection, nerve damage, blood clots, need for additional surgery, amputation and death.   Toni ArthursHEWITT, Phenix Grein, MD 10/15/2016, 9:30  AM

## 2016-10-16 ENCOUNTER — Encounter (HOSPITAL_BASED_OUTPATIENT_CLINIC_OR_DEPARTMENT_OTHER): Payer: Self-pay | Admitting: Orthopedic Surgery

## 2018-07-19 IMAGING — DX DG FOOT COMPLETE 3+V*L*
3 series · 3 of 3 positions shown · non-contrast
Comparison: 08/26/2007 left foot radiographs.

CLINICAL DATA: Left foot pain after sports injury today

EXAM:
LEFT FOOT - COMPLETE 3+ VIEW

[foot ap]
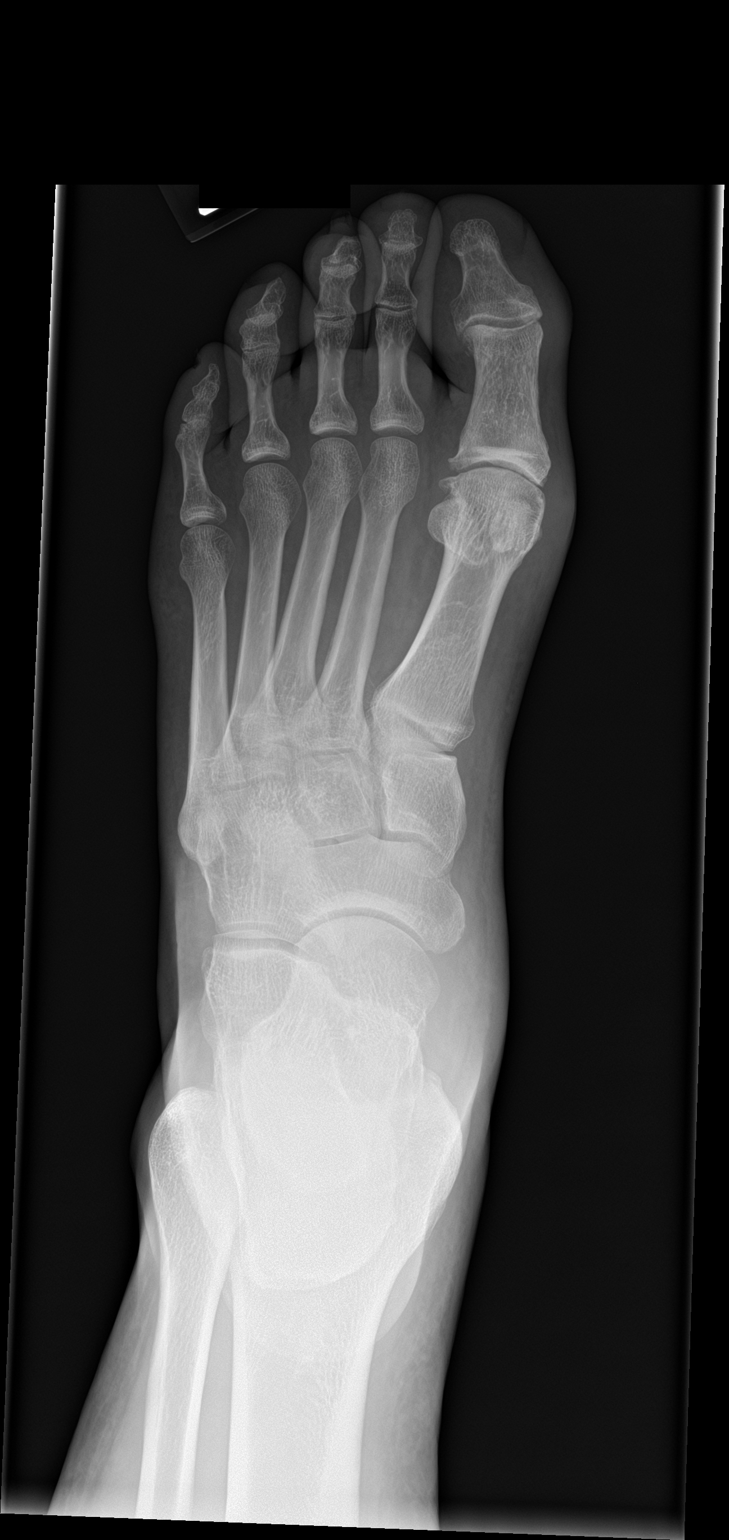

[foot obl]
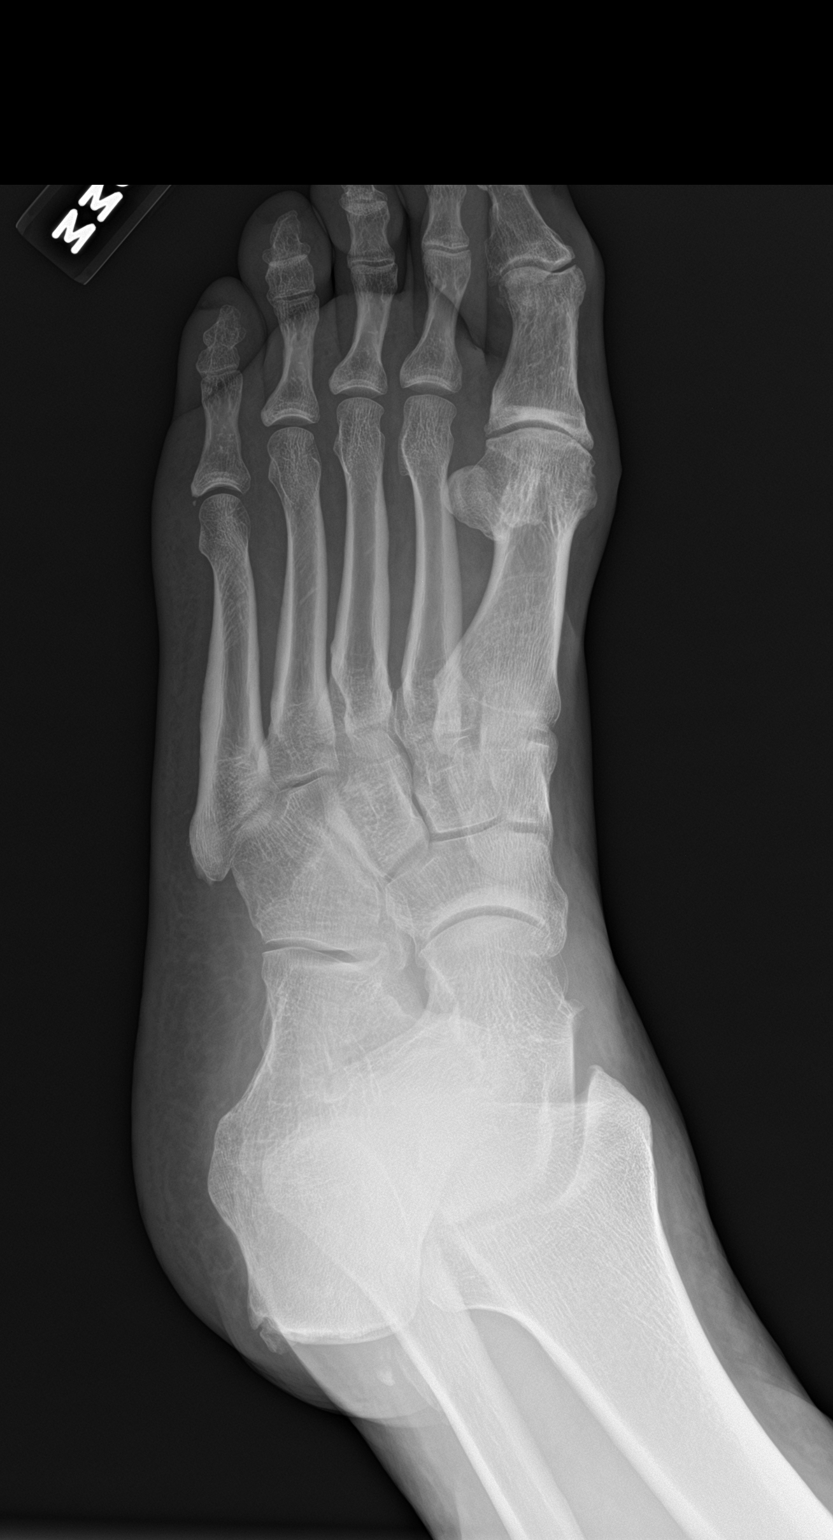

[foot lat]
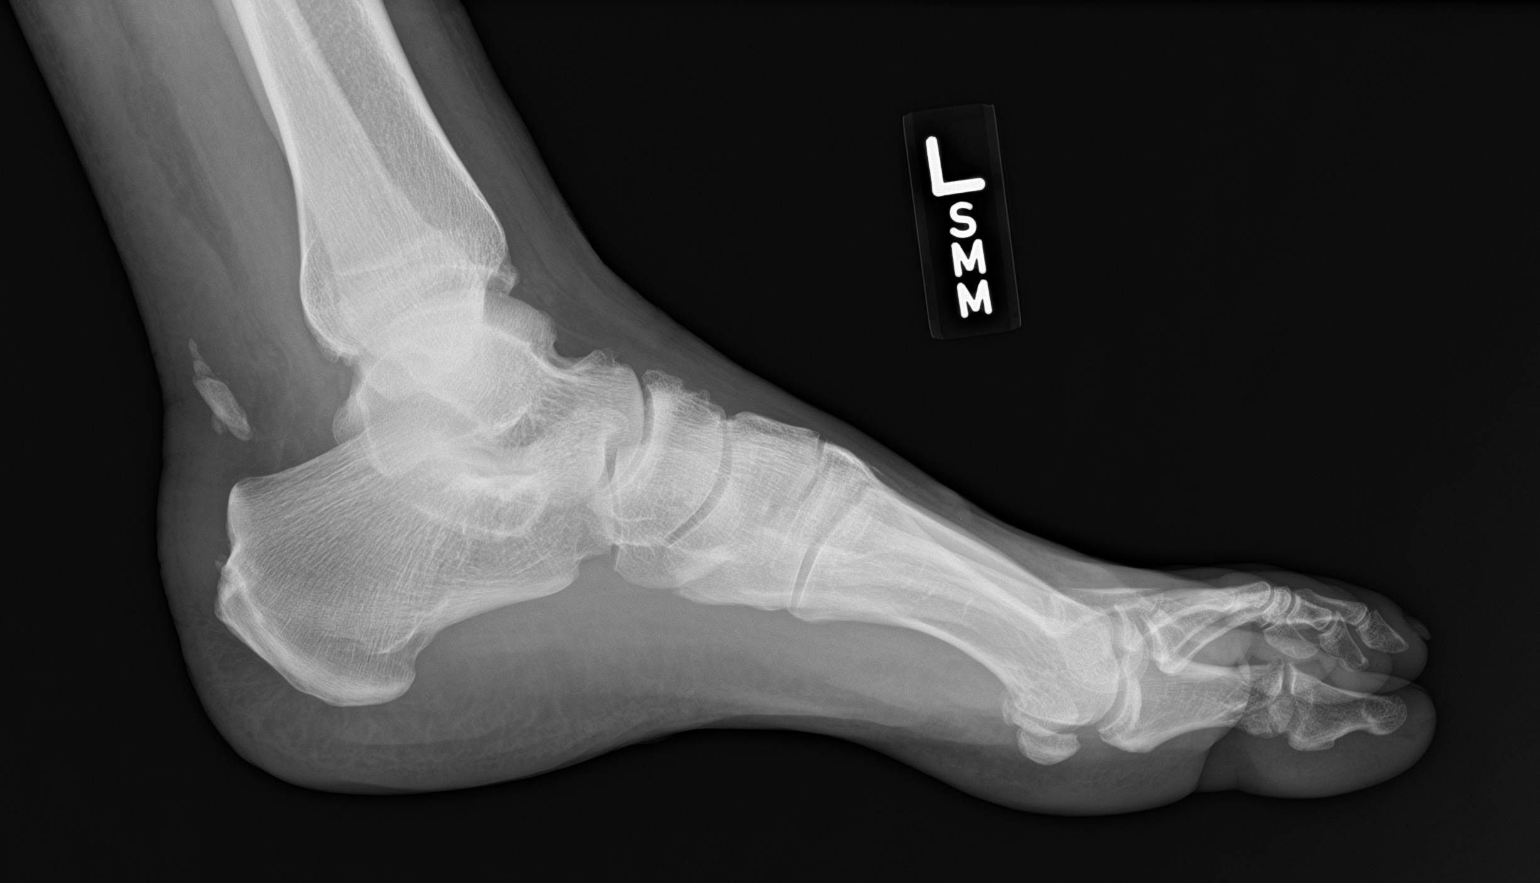

[3 of 3 positions shown; findings below may reference images not displayed]

FINDINGS: There is prominent soft tissue swelling throughout the region of the
left Achilles tendon. Enthesophytes are noted in the region of the
left Achilles tendon. No fracture or dislocation. No suspicious
focal osseous lesion. Mild to moderate first metatarsophalangeal
joint osteoarthritis. No radiopaque foreign body.
IMPRESSION: No fracture or dislocation in the left foot.

Prominent soft tissue swelling throughout the region of the left
Achilles tendon, worrisome for acute left Achilles tendon tear.

## 2020-06-19 ENCOUNTER — Telehealth: Payer: Self-pay | Admitting: *Deleted

## 2020-06-19 ENCOUNTER — Ambulatory Visit (AMBULATORY_SURGERY_CENTER): Payer: PRIVATE HEALTH INSURANCE | Admitting: *Deleted

## 2020-06-19 ENCOUNTER — Other Ambulatory Visit: Payer: Self-pay

## 2020-06-19 VITALS — Ht 74.0 in | Wt 201.0 lb

## 2020-06-19 DIAGNOSIS — Z1211 Encounter for screening for malignant neoplasm of colon: Secondary | ICD-10-CM

## 2020-06-19 NOTE — Telephone Encounter (Signed)
Virtual pre visit completed.  

## 2020-06-19 NOTE — Progress Notes (Addendum)
No egg or soy allergy known to patient  No issues with past sedation with any surgeries or procedures No intubation problems in the past  No FH of Malignant Hyperthermia No diet pills per patient No home 02 use per patient  No blood thinners per patient  Pt denies issues with constipation  No A fib or A flutter  EMMI video to pt or via MyChart  COVID 19 guidelines implemented in PV today with Pt and RN  Pt is fully vaccinated  for [employer] completed. Instructions mailed to patient.   Due to the COVID-19 pandemic we are asking patients to follow certain guidelines.  Pt aware of COVID protocols and [facility] guidelines

## 2020-07-03 ENCOUNTER — Encounter: Payer: Self-pay | Admitting: Internal Medicine

## 2020-07-03 ENCOUNTER — Ambulatory Visit (AMBULATORY_SURGERY_CENTER): Payer: PRIVATE HEALTH INSURANCE | Admitting: Internal Medicine

## 2020-07-03 ENCOUNTER — Other Ambulatory Visit: Payer: Self-pay

## 2020-07-03 VITALS — BP 123/80 | HR 53 | Temp 97.7°F | Resp 17 | Ht 74.0 in | Wt 201.0 lb

## 2020-07-03 DIAGNOSIS — Z1211 Encounter for screening for malignant neoplasm of colon: Secondary | ICD-10-CM | POA: Diagnosis present

## 2020-07-03 MED ORDER — SODIUM CHLORIDE 0.9 % IV SOLN: 500.0000 mL | INTRAVENOUS | Status: DC

## 2020-07-03 NOTE — Op Note (Signed)
[facility] Patient Name: [name] Wright Procedure Date: 07/03/2020 8:58 AM MRN: [id number] Endoscopist: Iva Boop , MD Age: 50 Referring MD:  Date of Birth: [birth year] Gender: Male Account #: [id number] Procedure:                Colonoscopy Indications:              Screening for colorectal malignant neoplasm, This                            is the patient's first colonoscopy Medicines:                Propofol per Anesthesia, Monitored Anesthesia Care Procedure:                Pre-Anesthesia Assessment:                           - Prior to the procedure, a History and Physical                            was performed, and patient medications and                            allergies were reviewed. The patient's tolerance of                            previous anesthesia was also reviewed. The risks                            and benefits of the procedure and the sedation                            options and risks were discussed with the patient.                            All questions were answered, and informed consent                            was obtained. Prior Anticoagulants: The patient has                            taken no previous anticoagulant or antiplatelet                            agents. ASA Grade Assessment: I - A normal, healthy                            patient. After reviewing the risks and benefits,                            the patient was deemed in satisfactory condition to                            undergo the procedure.  After obtaining informed consent, the colonoscope                            was passed under direct vision. Throughout the                            procedure, the patient's blood pressure, pulse, and                            oxygen saturations were monitored continuously. The                            Olympus CF-HQ190L [id number]) Colonoscope was                            introduced through  the anus and advanced to the the                            cecum, identified by appendiceal orifice and                            ileocecal valve. The quality of the bowel                            preparation was good. The colonoscopy was performed                            without difficulty. The patient tolerated the                            procedure well. The bowel preparation used was                            Miralax via split dose instruction. Scope In: 9:10:34 AM Scope Out: 9:26:33 AM Scope Withdrawal Time: 0 hours 11 minutes 23 seconds  Total Procedure Duration: 0 hours 15 minutes 59 seconds  Findings:                 The perianal and digital rectal examinations were                            normal. Pertinent negatives include normal prostate                            (size, shape, and consistency).                           The colon (entire examined portion) appeared normal.                           No additional abnormalities were found on                            retroflexion. Complications:            No immediate complications.  Estimated blood loss:                            None. Estimated Blood Loss:     Estimated blood loss: none. Recommendation:           - Repeat colonoscopy in 10 years for screening                            purposes.                           - Patient has a contact number available for                            emergencies. The signs and symptoms of potential                            delayed complications were discussed with the                            patient. Return to normal activities tomorrow.                            Written discharge instructions were provided to the                            patient.                           - Resume previous diet.                           - Continue present medications. Iva Boop, MD 07/03/2020 9:33:05 AM This report has been signed electronically.

## 2020-07-03 NOTE — Progress Notes (Signed)
Report given to PACU, vss 

## 2020-07-03 NOTE — Patient Instructions (Addendum)
Your colon is normal - no polyps or cancer seen.  Next routine colonoscopy or other screening test in 10 years - 2032.  I appreciate the opportunity to care for you. Iva Boop, MD, [facility]   Discharge instructions given. Normal exam. Resume previous medications. YOU HAD AN ENDOSCOPIC PROCEDURE TODAY AT THE [facility]:   Refer to the procedure report that was given to you for any specific questions about what was found during the examination.  If the procedure report does not answer your questions, please call your gastroenterologist to clarify.  If you requested that your care partner not be given the details of your procedure findings, then the procedure report has been included in a sealed envelope for you to review at your convenience later.  YOU SHOULD EXPECT: Some feelings of bloating in the abdomen. Passage of more gas than usual.  Walking can help get rid of the air that was put into your GI tract during the procedure and reduce the bloating. If you had a lower endoscopy (such as a colonoscopy or flexible sigmoidoscopy) you may notice spotting of blood in your stool or on the toilet paper. If you underwent a bowel prep for your procedure, you may not have a normal bowel movement for a few days.  Please Note:  You might notice some irritation and congestion in your nose or some drainage.  This is from the oxygen used during your procedure.  There is no need for concern and it should clear up in a day or so.  SYMPTOMS TO REPORT IMMEDIATELY:   Following lower endoscopy (colonoscopy or flexible sigmoidoscopy):  Excessive amounts of blood in the stool  Significant tenderness or worsening of abdominal pains  Swelling of the abdomen that is new, acute  Fever of 100F or higher   For urgent or emergent issues, a gastroenterologist can be reached at any hour by calling [phone]. Do not use MyChart messaging for urgent concerns.    DIET:  We do recommend a small  meal at first, but then you may proceed to your regular diet.  Drink plenty of fluids but you should avoid alcoholic beverages for 24 hours.  ACTIVITY:  You should plan to take it easy for the rest of today and you should NOT DRIVE or use heavy machinery until tomorrow (because of the sedation medicines used during the test).    FOLLOW UP: Our staff will call the number listed on your records 48-72 hours following your procedure to check on you and address any questions or concerns that you may have regarding the information given to you following your procedure. If we do not reach you, we will leave a message.  We will attempt to reach you two times.  During this call, we will ask if you have developed any symptoms of COVID 19. If you develop any symptoms (ie: fever, flu-like symptoms, shortness of breath, cough etc.) before then, please call [phone].  If you test positive for Covid 19 in the 2 weeks post procedure, please call and report this information to [place].    If any biopsies were taken you will be contacted by phone or by letter within the next 1-3 weeks.  Please call us at [phone] if you have not heard about the biopsies in 3 weeks.    SIGNATURES/CONFIDENTIALITY: You and/or your care partner have signed paperwork which will be entered into your electronic medical record.  These signatures attest to the fact that that the  information above on your After Visit Summary has been reviewed and is understood.  Full responsibility of the confidentiality of this discharge information lies with you and/or your care-partner.

## 2020-07-05 ENCOUNTER — Telehealth: Payer: Self-pay

## 2020-07-05 NOTE — Telephone Encounter (Signed)
No answer, left message to call back later today, B.Lattie Cervi RN. 

## 2020-07-05 NOTE — Telephone Encounter (Signed)
Second follow up attempt, no answer, LM 

## 2020-11-28 ENCOUNTER — Other Ambulatory Visit: Payer: Self-pay | Admitting: Internal Medicine

## 2020-11-28 DIAGNOSIS — Z0000 Encounter for general adult medical examination without abnormal findings: Secondary | ICD-10-CM

## 2022-02-23 ENCOUNTER — Other Ambulatory Visit: Payer: Self-pay | Admitting: Internal Medicine

## 2022-02-23 DIAGNOSIS — Z0000 Encounter for general adult medical examination without abnormal findings: Secondary | ICD-10-CM

## 2022-03-27 ENCOUNTER — Ambulatory Visit
Admission: RE | Admit: 2022-03-27 | Discharge: 2022-03-27 | Disposition: A | Discharge status: Home / Self Care | Payer: No Typology Code available for payment source | Source: Ambulatory Visit | Attending: Internal Medicine | Admitting: Internal Medicine

## 2022-03-27 DIAGNOSIS — Z0000 Encounter for general adult medical examination without abnormal findings: Secondary | ICD-10-CM

## 2022-04-02 ENCOUNTER — Ambulatory Visit: Payer: PRIVATE HEALTH INSURANCE | Admitting: Sports Medicine

## 2022-05-27 ENCOUNTER — Other Ambulatory Visit: Payer: Self-pay | Admitting: *Deleted

## 2022-05-27 DIAGNOSIS — M79604 Pain in right leg: Secondary | ICD-10-CM

## 2022-06-11 ENCOUNTER — Ambulatory Visit (INDEPENDENT_AMBULATORY_CARE_PROVIDER_SITE_OTHER): Payer: PRIVATE HEALTH INSURANCE | Admitting: Physician Assistant

## 2022-06-11 ENCOUNTER — Ambulatory Visit (HOSPITAL_COMMUNITY)
Admission: RE | Admit: 2022-06-11 | Discharge: 2022-06-11 | Disposition: A | Discharge status: Home / Self Care | Payer: PRIVATE HEALTH INSURANCE | Source: Ambulatory Visit | Attending: Physician Assistant | Admitting: Physician Assistant

## 2022-06-11 VITALS — BP 119/78 | HR 65 | Temp 97.2°F | Resp 16 | Ht 74.0 in | Wt 211.7 lb

## 2022-06-11 DIAGNOSIS — I872 Venous insufficiency (chronic) (peripheral): Secondary | ICD-10-CM

## 2022-06-11 DIAGNOSIS — M7989 Other specified soft tissue disorders: Secondary | ICD-10-CM

## 2022-06-11 DIAGNOSIS — M79604 Pain in right leg: Secondary | ICD-10-CM | POA: Diagnosis present

## 2022-06-11 DIAGNOSIS — M79605 Pain in left leg: Secondary | ICD-10-CM | POA: Diagnosis present

## 2022-06-11 NOTE — Progress Notes (Signed)
[facility] [place]   Reason for referral: Swollen B legs  History of Present Illness  [name] Wright is a 52 y.o. male who presents with chief complaint: swollen leg.  Patient notes, onset of swelling for years , associated with prolonged sitting and standing.  The patient has had negative history of DVT, positive history of varicose vein, no history of venous stasis ulcers, no history of  Lymphedema and no history of skin changes in lower legs.  There is a family history of venous disorders.  The patient has  used compression stockings in the past.  His father's side of the family has a history of varicose vein issues.  He has worn knee high compression in the past.  He denise rest pain, claudication and non healing wounds.  Past Medical History:  Diagnosis Date   Achilles tendon injury     Past Surgical History:  Procedure Laterality Date   EXCISION HAGLUND'S DEFORMITY WITH ACHILLES TENDON REPAIR Left 10/15/2016   Procedure: ACHILLES TENDON REPAIR WITH EXCISION HAGLUND'S DEFORMITY;  Surgeon: Wylene Simmer, MD;  Location: [facility];  Service: Orthopedics;  Laterality: Left;   GASTROCNEMIUS RECESSION Left 10/15/2016   Procedure: GASTROCNEMIUS RECESSION;  Surgeon: Wylene Simmer, MD;  Location: [place];  Service: Orthopedics;  Laterality: Left;   MOUTH SURGERY      Social History   Socioeconomic History   Marital status: Married    Spouse name: Not on file   Number of children: Not on file   Years of education: Not on file   Highest education level: Not on file  Occupational History   Not on file  Tobacco Use   Smoking status: Never   Smokeless tobacco: Never  Vaping Use   Vaping Use: Never used  Substance and Sexual Activity   Alcohol use: Yes    Alcohol/week: 15.0 standard drinks of alcohol    Types: 15 Cans of beer per week   Drug use: No   Sexual activity: Not on file  Other Topics Concern   Not on file  Social  History Narrative   Not on file   Social Determinants of Health   Financial Resource Strain: Not on file  Food Insecurity: Not on file  Transportation Needs: Not on file  Physical Activity: Not on file  Stress: Not on file  Social Connections: Not on file  Intimate Partner Violence: Not on file    Family History  Problem Relation Age of Onset   Colon cancer Neg Hx    Colon polyps Neg Hx    Esophageal cancer Neg Hx    Stomach cancer Neg Hx    Rectal cancer Neg Hx     Current Outpatient Medications on File Prior to Visit  Medication Sig Dispense Refill   cholecalciferol (VITAMIN D3) 25 MCG (1000 UNIT) tablet Take by mouth daily.     Multiple Vitamin (MULTIVITAMIN) tablet Take 1 tablet by mouth daily.     glucosamine-chondroitin 500-400 MG tablet Take 1 tablet by mouth 3 (three) times daily.     Polyethylene Glycol 3350 (MIRALAX PO) Take 238 g by mouth once. Colonoscopy prep     No current facility-administered medications on file prior to visit.    Allergies as of 06/11/2022   (No Known Allergies)     ROS:   General:  No weight loss, Fever, chills  HEENT: No recent headaches, no nasal bleeding, no visual changes, no sore throat  Neurologic: No dizziness,  blackouts, seizures. No recent symptoms of stroke or mini- stroke. No recent episodes of slurred speech, or temporary blindness.  Cardiac: No recent episodes of chest pain/pressure, no shortness of breath at rest.  No shortness of breath with exertion.  Denies history of atrial fibrillation or irregular heartbeat  Vascular: No history of rest pain in feet.  No history of claudication.  No history of non-healing ulcer, No history of DVT positive varicose veins and history of thrombophlebitis.   Pulmonary: No home oxygen, no productive cough, no hemoptysis,  No asthma or wheezing  Musculoskeletal:  [ ]  Arthritis, [ ]  Low back pain,  [ ]  Joint pain  Hematologic:No history of hypercoagulable state.  No history of easy  bleeding.  No history of anemia  Gastrointestinal: No hematochezia or melena,  No gastroesophageal reflux, no trouble swallowing  Urinary: [ ]  chronic Kidney disease, [ ]  on HD - [ ]  MWF or [ ]  TTHS, [ ]  Burning with urination, [ ]  Frequent urination, [ ]  Difficulty urinating;   Skin: No rashes  Psychological: No history of anxiety,  No history of depression  Physical Examination  Vitals:   06/11/22 1235  BP: 119/78  Pulse: 65  Resp: 16  Temp: (!) 97.2 F (36.2 C)  TempSrc: Temporal  SpO2: 99%  Weight: 211 lb 11.2 oz (96 kg)  Height: 6\' 2"  (1.88 m)    Body mass index is 27.18 kg/m.  General:  Alert and oriented, no acute distress HEENT: Normal Neck: No bruit or JVD Pulmonary: Clear to auscultation bilaterally Cardiac: Regular Rate and Rhythm without murmur Abdomen: Soft, non-tender, non-distended, no mass, no scars Skin: No rash     Extremity Pulses:  2+ radial,  femoral, dorsalis pedis,  pulses bilaterally Musculoskeletal: No deformity or edema  Neurologic: Upper and lower extremity motor 5/5 and symmetric  DATA:  Venous Reflux Times  +--------------+---------+------+-----------+------------+--------+  RIGHT        Reflux NoRefluxReflux TimeDiameter cmsComments                          Yes                                   +--------------+---------+------+-----------+------------+--------+  CFV                    yes   >1 second                       +--------------+---------+------+-----------+------------+--------+  FV prox       no                                              +--------------+---------+------+-----------+------------+--------+  FV mid                  yes   >1 second                       +--------------+---------+------+-----------+------------+--------+  FV dist                 yes   >1 second                       +--------------+---------+------+-----------+------------+--------+  Popliteal  no                                              +--------------+---------+------+-----------+------------+--------+  GSV at [facility]              yes    >500 ms      1.14              +--------------+---------+------+-----------+------------+--------+  GSV prox thigh          yes    >500 ms      1.67              +--------------+---------+------+-----------+------------+--------+  GSV mid thigh           yes    >500 ms      0.84              +--------------+---------+------+-----------+------------+--------+  GSV dist thigh          yes    >500 ms      0.83              +--------------+---------+------+-----------+------------+--------+  GSV at knee             yes    >500 ms      1.10              +--------------+---------+------+-----------+------------+--------+  GSV prox calf           yes    >500 ms      0.81              +--------------+---------+------+-----------+------------+--------+  SSV Pop Fossa no                            0.58              +--------------+---------+------+-----------+------------+--------+  SSV prox calf no                            0.52              +--------------+---------+------+-----------+------------+--------+      Summary:  Right:  - No evidence of deep vein thrombosis seen in the right lower extremity,  from the common femoral through the popliteal veins.  - No evidence of superficial venous thrombosis in the right lower  extremity.    - No evidence of superficial venous reflux seen in the right short  saphenous vein.    - Venous reflux is noted in the right common femoral vein.  - Venous reflux is noted in the right sapheno-femoral junction.  - Venous reflux is noted in the right greater saphenous vein in the thigh.  - Venous reflux is noted in the right greater saphenous vein in the calf.  - Venous reflux is noted in the right femoral vein.   Assessment/Plan:  Venous reflux  with large varicose veins.  He has a history of thrombophlebitis in 2018 The duplex demonstrates mixed deep reflux with SFJ to the calf GSV reflux. The vein size is enlarged > 0.5 throughout it's course.   He has varicose veins on the left LE as well.  No ischemic skin changes or brawny kin discoloration.    He has palpable pedal pulses and is not at risk of limb loss.     He will be  be placed in thigh high compression 20-30 mm hg, elevation, exercise as tolerates.  He will f/u in 3 months for venous duplex on the left LE.   He will be evaluated for possible intervention to include stab phlebectomy and or laser ablation.        Mosetta Pigeon PA-C [facility] [place] Office: [phone]  MD in clinic [place]

## 2022-06-18 ENCOUNTER — Other Ambulatory Visit: Payer: Self-pay

## 2022-06-18 DIAGNOSIS — I872 Venous insufficiency (chronic) (peripheral): Secondary | ICD-10-CM

## 2022-09-16 ENCOUNTER — Ambulatory Visit: Payer: PRIVATE HEALTH INSURANCE | Admitting: Vascular Surgery

## 2022-09-16 ENCOUNTER — Encounter (HOSPITAL_COMMUNITY): Payer: PRIVATE HEALTH INSURANCE

## 2022-09-23 ENCOUNTER — Encounter: Payer: Self-pay | Admitting: Vascular Surgery

## 2022-09-23 ENCOUNTER — Ambulatory Visit (INDEPENDENT_AMBULATORY_CARE_PROVIDER_SITE_OTHER): Payer: PRIVATE HEALTH INSURANCE | Admitting: Vascular Surgery

## 2022-09-23 ENCOUNTER — Ambulatory Visit (HOSPITAL_COMMUNITY)
Admission: RE | Admit: 2022-09-23 | Discharge: 2022-09-23 | Disposition: A | Discharge status: Home / Self Care | Payer: PRIVATE HEALTH INSURANCE | Source: Ambulatory Visit | Attending: Vascular Surgery | Admitting: Vascular Surgery

## 2022-09-23 VITALS — BP 129/81 | HR 59 | Temp 98.2°F | Resp 20 | Ht 74.0 in | Wt 218.0 lb

## 2022-09-23 DIAGNOSIS — I872 Venous insufficiency (chronic) (peripheral): Secondary | ICD-10-CM | POA: Diagnosis present

## 2022-09-23 NOTE — Progress Notes (Signed)
Patient ID: [name] Wright, male   DOB: November 12, 1970, 52 y.o.   MRN: [id number]  Reason for Consult: Follow-up   Referred by Creola Corn, MD  Subjective:     HPI:  [name] Wright is a 52 y.o. male with swelling of his bilateral lower extremities without any history of DVT.  He does have strong family history of venous disease in the past.  Currently has been wearing compression socks bilaterally with his greatest pain on the right lower extremity relative to the left.  He also has associated swelling and skin changes bilaterally but again worse on the right without any tissue loss or ulceration.  Patient remains very active and play soccer on Wednesday evenings and [employer].  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 10/15/2016   Procedure: ACHILLES TENDON REPAIR WITH EXCISION HAGLUND'S DEFORMITY;  Surgeon: Toni Arthurs, MD;  Location: [facility];  Service: Orthopedics;  Laterality: Left;   GASTROCNEMIUS RECESSION Left 10/15/2016   Procedure: GASTROCNEMIUS RECESSION;  Surgeon: Toni Arthurs, MD;  Location: [facility];  Service: Orthopedics;  Laterality: Left;   MOUTH SURGERY      Short Social History:  Social History   Tobacco Use   Smoking status: Never   Smokeless tobacco: Never  Substance Use Topics   Alcohol use: Yes    Alcohol/week: 15.0 standard drinks of alcohol    Types: 15 Cans of beer per week    No Known Allergies  Current Outpatient Medications  Medication Sig Dispense Refill   cholecalciferol (VITAMIN D3) 25 MCG (1000 UNIT) tablet Take by mouth daily.     glucosamine-chondroitin 500-400 MG tablet Take 1  tablet by mouth 3 (three) times daily.     Multiple Vitamin (MULTIVITAMIN) tablet Take 1 tablet by mouth daily.     Polyethylene Glycol 3350 (MIRALAX PO) Take 238 g by mouth once. Colonoscopy prep     No current facility-administered medications for this visit.    Review of Systems  Constitutional:  Constitutional negative. HENT: HENT negative.  Eyes: Eyes negative.  Respiratory: Respiratory negative.  Cardiovascular: Positive for leg swelling.  GI: Gastrointestinal negative.  Musculoskeletal: Positive for leg pain.  Neurological: Neurological negative. Hematologic: Hematologic/lymphatic negative.  Psychiatric: Psychiatric negative.        Objective:  Objective   Vitals:   09/23/22 1128  BP: 129/81  Pulse: (!) 59  Resp: 20  Temp: 98.2 F (36.8 C)  SpO2: 98%  Weight: 218 lb (98.9 kg)  Height:  (1.88 m)   Body mass index is 27.99 kg/m.  Physical Exam HENT:     Head: Normocephalic.     Nose: Nose normal.  Eyes:     Pupils: Pupils are equal, round, and reactive to light.  Cardiovascular:     Rate and Rhythm: Normal rate.     Pulses: Normal pulses.  Pulmonary:     Effort: Pulmonary effort is normal.  Abdominal:     General: Abdomen is flat.  Musculoskeletal:     Right lower leg: Edema present.     Left lower  leg: No edema.  Skin:    General: Skin is warm.     Capillary Refill: Capillary refill takes less than 2 seconds.  Neurological:     General: No focal deficit present.     Mental Status: He is alert.  Psychiatric:        Mood and Affect: Mood normal.        Behavior: Behavior normal.        Thought Content: Thought content normal.        Judgment: Judgment normal.        Data:  Venous Reflux Times  +--------------+---------+------+-----------+------------+--------+  RIGHT        Reflux NoRefluxReflux TimeDiameter cmsComments                          Yes                                    +--------------+---------+------+-----------+------------+--------+  CFV                    yes   >1 second                       +--------------+---------+------+-----------+------------+--------+  FV prox       no                                              +--------------+---------+------+-----------+------------+--------+  FV mid                  yes   >1 second                       +--------------+---------+------+-----------+------------+--------+  FV dist                 yes   >1 second                       +--------------+---------+------+-----------+------------+--------+  Popliteal    no                                              +--------------+---------+------+-----------+------------+--------+  GSV at SFJ              yes    >500 ms      1.14              +--------------+---------+------+-----------+------------+--------+  GSV prox thigh          yes    >500 ms      1.67              +--------------+---------+------+-----------+------------+--------+  GSV mid thigh           yes    >500 ms      0.84              +--------------+---------+------+-----------+------------+--------+  GSV dist thigh          yes    >500 ms      0.83              +--------------+---------+------+-----------+------------+--------+  GSV at knee  yes    >500 ms      1.10              +--------------+---------+------+-----------+------------+--------+  GSV prox calf           yes    >500 ms      0.81              +--------------+---------+------+-----------+------------+--------+  SSV Pop Fossa no                            0.58              +--------------+---------+------+-----------+------------+--------+  SSV prox calf no                            0.52              +--------------+---------+------+-----------+------------+--------+        LEFT          Reflux NoRefluxReflux  TimeDiameter cmsComments                          Yes                                   +--------------+---------+------+-----------+------------+--------+  CFV                    yes   >1 second                       +--------------+---------+------+-----------+------------+--------+  FV mid        no                                              +--------------+---------+------+-----------+------------+--------+  Popliteal    no                                              +--------------+---------+------+-----------+------------+--------+  GSV at SFJ              yes    >500 ms      .889              +--------------+---------+------+-----------+------------+--------+  GSV prox thigh          yes    >500 ms      .773              +--------------+---------+------+-----------+------------+--------+  GSV mid thigh           yes    >500 ms      .663              +--------------+---------+------+-----------+------------+--------+  GSV dist thigh          yes    >500 ms      .711              +--------------+---------+------+-----------+------------+--------+  GSV at knee             yes    >500 ms      1.28              +--------------+---------+------+-----------+------------+--------+  GSV prox calf           yes    >500 ms      .875              +--------------+---------+------+-----------+------------+--------+  SSV Pop Fossa no                            .242              +--------------+---------+------+-----------+------------+--------+  SSV prox calf no                            .186              +--------------+---------+------+-----------+------------+--------+  SSV mid calf  no                            .281              +--------------+---------+------+-----------+------------+--------+              Assessment/Plan:     52 year old male with right lower extremity C4 a venous  disease and left lower extremity C3 venous disease with associated multiple varicosities.  We discussed proceeding with intervention on the right lower extremity to include great saphenous vein ablation with greater than 20 stab phlebectomy sites.  I reviewed his previous venous duplex which demonstrates large refluxing great saphenous veins bilaterally and also evaluated his saphenous veins at bedside today and they are very large and within the fascia although on the right side somewhat close to the skin and will require significant tumescent anesthesia for protection of skin to perform saphenous vein ablation.  We discussed the need for continued compression stockings as well as the risk and benefits of the procedure he demonstrates good understanding and we will get him scheduled to begin with the right leg and can consider left lower extremity treatment in the future when healed from the right.    Maeola Harman MD [facility]

## 2024-06-12 ENCOUNTER — Ambulatory Visit (HOSPITAL_COMMUNITY)
Admission: RE | Admit: 2024-06-12 | Discharge: 2024-06-12 | Disposition: A | Discharge status: Home / Self Care | Payer: PRIVATE HEALTH INSURANCE | Source: Ambulatory Visit | Attending: Surgery | Admitting: Surgery

## 2024-06-12 ENCOUNTER — Other Ambulatory Visit (HOSPITAL_COMMUNITY): Payer: Self-pay | Admitting: Family Medicine

## 2024-06-12 DIAGNOSIS — M79604 Pain in right leg: Secondary | ICD-10-CM | POA: Insufficient documentation

## 2024-06-12 DIAGNOSIS — M7989 Other specified soft tissue disorders: Secondary | ICD-10-CM | POA: Diagnosis not present
